# Patient Record
Sex: Female | Born: 1990
Health system: Southern US, Community
[De-identification: ages and names within clinical notes are randomized; demographics above are authoritative.]

## PROBLEM LIST (undated history)

## (undated) DIAGNOSIS — J309 Allergic rhinitis, unspecified: Secondary | ICD-10-CM

## (undated) DIAGNOSIS — U071 COVID-19: Secondary | ICD-10-CM

## (undated) DIAGNOSIS — T7840XA Allergy, unspecified, initial encounter: Secondary | ICD-10-CM

## (undated) DIAGNOSIS — F419 Anxiety disorder, unspecified: Secondary | ICD-10-CM

## (undated) HISTORY — DX: Allergic rhinitis, unspecified: J30.9

## (undated) HISTORY — DX: Allergy, unspecified, initial encounter: T78.40XA

## (undated) HISTORY — PX: WISDOM TOOTH EXTRACTION: SHX21

## (undated) HISTORY — DX: COVID-19: U07.1

## (undated) HISTORY — DX: Anxiety disorder, unspecified: F41.9

---

## 2019-07-02 DIAGNOSIS — Z01419 Encounter for gynecological examination (general) (routine) without abnormal findings: Secondary | ICD-10-CM | POA: Diagnosis not present

## 2019-11-08 DIAGNOSIS — Z20822 Contact with and (suspected) exposure to covid-19: Secondary | ICD-10-CM | POA: Diagnosis not present

## 2019-11-12 DIAGNOSIS — F411 Generalized anxiety disorder: Secondary | ICD-10-CM | POA: Diagnosis not present

## 2019-11-17 DIAGNOSIS — F411 Generalized anxiety disorder: Secondary | ICD-10-CM | POA: Diagnosis not present

## 2019-12-02 DIAGNOSIS — F411 Generalized anxiety disorder: Secondary | ICD-10-CM | POA: Diagnosis not present

## 2019-12-16 DIAGNOSIS — F411 Generalized anxiety disorder: Secondary | ICD-10-CM | POA: Diagnosis not present

## 2019-12-23 DIAGNOSIS — F411 Generalized anxiety disorder: Secondary | ICD-10-CM | POA: Diagnosis not present

## 2019-12-30 DIAGNOSIS — F411 Generalized anxiety disorder: Secondary | ICD-10-CM | POA: Diagnosis not present

## 2020-01-06 DIAGNOSIS — F411 Generalized anxiety disorder: Secondary | ICD-10-CM | POA: Diagnosis not present

## 2020-01-13 DIAGNOSIS — F411 Generalized anxiety disorder: Secondary | ICD-10-CM | POA: Diagnosis not present

## 2020-01-20 DIAGNOSIS — F411 Generalized anxiety disorder: Secondary | ICD-10-CM | POA: Diagnosis not present

## 2020-02-10 DIAGNOSIS — F411 Generalized anxiety disorder: Secondary | ICD-10-CM | POA: Diagnosis not present

## 2020-02-17 DIAGNOSIS — F411 Generalized anxiety disorder: Secondary | ICD-10-CM | POA: Diagnosis not present

## 2020-02-23 DIAGNOSIS — F411 Generalized anxiety disorder: Secondary | ICD-10-CM | POA: Diagnosis not present

## 2020-03-02 DIAGNOSIS — F411 Generalized anxiety disorder: Secondary | ICD-10-CM | POA: Diagnosis not present

## 2020-03-09 DIAGNOSIS — F411 Generalized anxiety disorder: Secondary | ICD-10-CM | POA: Diagnosis not present

## 2020-03-16 DIAGNOSIS — F411 Generalized anxiety disorder: Secondary | ICD-10-CM | POA: Diagnosis not present

## 2020-03-23 DIAGNOSIS — F411 Generalized anxiety disorder: Secondary | ICD-10-CM | POA: Diagnosis not present

## 2020-04-06 DIAGNOSIS — F411 Generalized anxiety disorder: Secondary | ICD-10-CM | POA: Diagnosis not present

## 2020-04-13 DIAGNOSIS — F411 Generalized anxiety disorder: Secondary | ICD-10-CM | POA: Diagnosis not present

## 2020-04-20 DIAGNOSIS — F411 Generalized anxiety disorder: Secondary | ICD-10-CM | POA: Diagnosis not present

## 2020-05-11 DIAGNOSIS — F411 Generalized anxiety disorder: Secondary | ICD-10-CM | POA: Diagnosis not present

## 2020-05-18 DIAGNOSIS — F411 Generalized anxiety disorder: Secondary | ICD-10-CM | POA: Diagnosis not present

## 2020-05-25 DIAGNOSIS — F411 Generalized anxiety disorder: Secondary | ICD-10-CM | POA: Diagnosis not present

## 2020-07-14 DIAGNOSIS — S39012A Strain of muscle, fascia and tendon of lower back, initial encounter: Secondary | ICD-10-CM | POA: Diagnosis not present

## 2020-08-31 DIAGNOSIS — Z01419 Encounter for gynecological examination (general) (routine) without abnormal findings: Secondary | ICD-10-CM | POA: Diagnosis not present

## 2020-08-31 DIAGNOSIS — Z1331 Encounter for screening for depression: Secondary | ICD-10-CM | POA: Diagnosis not present

## 2020-08-31 DIAGNOSIS — Z1239 Encounter for other screening for malignant neoplasm of breast: Secondary | ICD-10-CM | POA: Diagnosis not present

## 2020-08-31 DIAGNOSIS — N898 Other specified noninflammatory disorders of vagina: Secondary | ICD-10-CM | POA: Diagnosis not present

## 2020-10-17 DIAGNOSIS — Z1152 Encounter for screening for COVID-19: Secondary | ICD-10-CM | POA: Diagnosis not present

## 2020-10-17 DIAGNOSIS — Z03818 Encounter for observation for suspected exposure to other biological agents ruled out: Secondary | ICD-10-CM | POA: Diagnosis not present

## 2020-10-18 DIAGNOSIS — M791 Myalgia, unspecified site: Secondary | ICD-10-CM | POA: Diagnosis not present

## 2020-10-18 DIAGNOSIS — U071 COVID-19: Secondary | ICD-10-CM | POA: Diagnosis not present

## 2020-11-06 ENCOUNTER — Ambulatory Visit: Payer: Self-pay | Admitting: Family Medicine

## 2020-11-20 ENCOUNTER — Encounter: Payer: Self-pay | Admitting: Family Medicine

## 2020-11-20 ENCOUNTER — Other Ambulatory Visit: Payer: Self-pay

## 2020-11-20 ENCOUNTER — Ambulatory Visit: Payer: Federal, State, Local not specified - PPO | Admitting: Family Medicine

## 2020-11-20 VITALS — BP 117/78 | HR 91 | Temp 98.0°F | Ht 65.0 in | Wt 212.0 lb

## 2020-11-20 DIAGNOSIS — R200 Anesthesia of skin: Secondary | ICD-10-CM | POA: Diagnosis not present

## 2020-11-20 DIAGNOSIS — Z8616 Personal history of COVID-19: Secondary | ICD-10-CM | POA: Diagnosis not present

## 2020-11-20 DIAGNOSIS — R062 Wheezing: Secondary | ICD-10-CM | POA: Diagnosis not present

## 2020-11-20 MED ORDER — ALBUTEROL SULFATE HFA 108 (90 BASE) MCG/ACT IN AERS: 2.0000 | INHALATION_SPRAY | Freq: Four times a day (QID) | RESPIRATORY_TRACT | 0 refills | Status: DC | PRN

## 2020-11-20 NOTE — Progress Notes (Signed)
New Patient Office Visit  Subjective:  Patient ID: Brandi Ward, female    DOB: [birth year]  Age: 30 y.o. MRN: [id number]  CC:  Chief Complaint  Patient presents with  . Numbness    Bilateral hand numbness and tingling.     HPI Brandi Ward presents for bilateral hand tingling and numbness that began in Oct. 2021 without any awareness of trauma. Pt says the left hand is effected most sometimes is painful. october was a stressful time - grandmother passed away can last hours to days L hand started hurting in mid Nov none in last 2 wks, L>R ulnar side numbness   Pt would also like to discuss wheezing and shortness of breath - lingering post covid symptom that seems to come on periodically. Pt says she had covid in December 2021.  no h/o asthma, 2-3 times per week, worse at night  Past Medical History:  Diagnosis Date  . Allergic rhinitis   . Anxiety   . COVID-19     Past Surgical History:  Procedure Laterality Date  . WISDOM TOOTH EXTRACTION      Family History  Problem Relation Age of Onset  . Hyperlipidemia Mother   . Hypertension Father   . Dementia Maternal Grandmother   . Lymphoma Maternal Grandfather   . Kidney cancer Paternal Grandmother   . Heart disease Paternal Grandfather   . Suicidality Maternal Uncle   . Breast cancer Neg Hx   . Colon cancer Neg Hx     Social History   Socioeconomic History  . Marital status: Single    Spouse name: Not on file  . Number of children: 0  . Years of education: Not on file  . Highest education level: Not on file  Occupational History  . Occupation: English as a second language teacher    Comment: [employer]  Tobacco Use  . Smoking status: Never Smoker  . Smokeless tobacco: Never Used  Vaping Use  . Vaping Use: Never used  Substance and Sexual Activity  . Alcohol use: Yes    Alcohol/week: 1.0 standard drink    Types: 1 Glasses of wine per week    Comment: one glass of wine every two weeks  . Drug use: Never  . Sexual  activity: Yes    Partners: Male    Birth control/protection: Pill  Other Topics Concern  . Not on file  Social History Narrative  . Not on file   Social Determinants of Health   Financial Resource Strain: Not on file  Food Insecurity: Not on file  Transportation Needs: Not on file  Physical Activity: Not on file  Stress: Not on file  Social Connections: Not on file  Intimate Partner Violence: Not on file    ROS Review of Systems  Constitutional: Negative.   HENT: Negative.   Eyes: Negative.   Respiratory: Positive for cough, chest tightness and wheezing.   Cardiovascular: Negative.   Gastrointestinal: Negative.   Endocrine: Negative.   Genitourinary: Negative.   Musculoskeletal: Negative.   Skin: Negative.   Allergic/Immunologic: Positive for environmental allergies.  Neurological: Negative.   Hematological: Negative.   Psychiatric/Behavioral: The patient is nervous/anxious.     Objective:   Today's Vitals: BP 117/78 (BP Location: Left Arm, Patient Position: Sitting, Cuff Size: Large)   Pulse 91   Temp 98 F (36.7 C) (Oral)   Ht 5\' 5"  (1.651 m)   Wt 212 lb (96.2 kg)   SpO2 100%   BMI 35.28 kg/m   Physical Exam Vitals  reviewed.  Constitutional:      General: She is not in acute distress.    Appearance: Normal appearance. She is well-developed. She is not diaphoretic.  HENT:     Head: Normocephalic and atraumatic.     Right Ear: Tympanic membrane, ear canal and external ear normal.     Left Ear: Tympanic membrane, ear canal and external ear normal.     Nose: Nose normal.     Mouth/Throat:     Mouth: Mucous membranes are moist.     Pharynx: Oropharynx is clear. No oropharyngeal exudate.  Eyes:     General: No scleral icterus.    Conjunctiva/sclera: Conjunctivae normal.     Pupils: Pupils are equal, round, and reactive to light.  Neck:     Thyroid: No thyromegaly.  Cardiovascular:     Rate and Rhythm: Normal rate and regular rhythm.     Pulses: Normal  pulses.     Heart sounds: Normal heart sounds. No murmur heard.   Pulmonary:     Effort: Pulmonary effort is normal. No respiratory distress.     Breath sounds: Normal breath sounds. No wheezing or rales.  Abdominal:     General: There is no distension.     Palpations: Abdomen is soft.     Tenderness: There is no abdominal tenderness.  Musculoskeletal:        General: No deformity.     Cervical back: Neck supple.     Right lower leg: No edema.     Left lower leg: No edema.     Comments: No TTP on UEs. Negative Tinels and Phalens b/l No symptoms with pressure over ulnar nerve at elbow.  Lymphadenopathy:     Cervical: No cervical adenopathy.  Skin:    General: Skin is warm and dry.     Findings: No rash.  Neurological:     Mental Status: She is alert and oriented to person, place, and time. Mental status is at baseline.     Sensory: No sensory deficit.     Motor: No weakness.     Gait: Gait normal.  Psychiatric:        Mood and Affect: Mood normal.        Behavior: Behavior normal.        Thought Content: Thought content normal.     Assessment & Plan:   1. History of COVID-19 2. Wheezing - lungs clear today - no fever or symptoms concerning for pneumonia at this time - suspect residual bronchial inflammation after COVID 19 - discussed natural course, symptomatic management and return precautions - albuterol prn  3. Bilateral hand numbness - new problem x4 months - benign exam today - given bilateral nature, suspect distal issue, rather than neck radiculopathy - strength and sensation is intact on exam today - distribution is in ulnar nerve distribution - will refer to Ortho for possible NCS - discussed possibility of positional issue as she works at a desk often, could consider OT in the future also - Ambulatory referral to Orthopedic Surgery   Meds ordered this encounter  Medications  . albuterol (VENTOLIN HFA) 108 (90 Base) MCG/ACT inhaler    Sig: Inhale 2  puffs into the lungs every 6 (six) hours as needed for wheezing or shortness of breath.    Dispense:  8 g    Refill:  0    Return in about 6 months (around 05/20/2021) for CPE.  Total time spent on today's visit was greater than 45 minutes,  including both face-to-face time and nonface-to-face time personally spent on review of chart (labs and imaging), discussing labs and goals, discussing further work-up, treatment options, referrals to specialist if needed, reviewing outside records of pertinent, answering patient's questions, and coordinating care.   I, Shirlee Latch, MD, have reviewed all documentation for this visit. The documentation on 11/20/20 for the exam, diagnosis, procedures, and orders are all accurate and complete.   Bacigalupo, Marzella Schlein, MD, MPH [facility]

## 2020-11-20 NOTE — Patient Instructions (Signed)
Congratulations on your Covid-19 vaccination!  Please bring in your Covid-19 vaccination card to your next appointment or upload a picture to your MyChart for us to keep track of your vaccinations.  Thank you!     

## 2020-11-21 ENCOUNTER — Other Ambulatory Visit: Payer: Self-pay

## 2020-11-21 NOTE — Telephone Encounter (Signed)
Medication updated

## 2020-11-30 DIAGNOSIS — G5622 Lesion of ulnar nerve, left upper limb: Secondary | ICD-10-CM | POA: Diagnosis not present

## 2020-12-13 ENCOUNTER — Other Ambulatory Visit: Payer: Self-pay | Admitting: Family Medicine

## 2021-01-23 DIAGNOSIS — K080 Exfoliation of teeth due to systemic causes: Secondary | ICD-10-CM | POA: Diagnosis not present

## 2021-05-07 ENCOUNTER — Other Ambulatory Visit: Payer: Self-pay

## 2021-05-07 ENCOUNTER — Encounter (HOSPITAL_COMMUNITY): Payer: Self-pay | Admitting: Emergency Medicine

## 2021-05-07 ENCOUNTER — Emergency Department (HOSPITAL_COMMUNITY)
Admission: EM | Admit: 2021-05-07 | Discharge: 2021-05-08 | Disposition: A | Discharge status: Home / Self Care | Payer: Federal, State, Local not specified - PPO | Attending: Emergency Medicine | Admitting: Emergency Medicine

## 2021-05-07 DIAGNOSIS — K5900 Constipation, unspecified: Secondary | ICD-10-CM | POA: Diagnosis not present

## 2021-05-07 DIAGNOSIS — Z20822 Contact with and (suspected) exposure to covid-19: Secondary | ICD-10-CM | POA: Insufficient documentation

## 2021-05-07 DIAGNOSIS — Z8616 Personal history of COVID-19: Secondary | ICD-10-CM | POA: Diagnosis not present

## 2021-05-07 DIAGNOSIS — N83202 Unspecified ovarian cyst, left side: Secondary | ICD-10-CM | POA: Insufficient documentation

## 2021-05-07 DIAGNOSIS — R109 Unspecified abdominal pain: Secondary | ICD-10-CM | POA: Diagnosis not present

## 2021-05-07 DIAGNOSIS — R1084 Generalized abdominal pain: Secondary | ICD-10-CM | POA: Diagnosis not present

## 2021-05-07 LAB — URINALYSIS, ROUTINE W REFLEX MICROSCOPIC
Bilirubin Urine: NEGATIVE
Glucose, UA: NEGATIVE mg/dL
Hgb urine dipstick: NEGATIVE
Ketones, ur: NEGATIVE mg/dL
Leukocytes,Ua: NEGATIVE
Nitrite: NEGATIVE
Protein, ur: NEGATIVE mg/dL
Specific Gravity, Urine: 1.011 (ref 1.005–1.030)
pH: 5 (ref 5.0–8.0)

## 2021-05-07 LAB — COMPREHENSIVE METABOLIC PANEL
ALT: 29 U/L (ref 0–44)
AST: 29 U/L (ref 15–41)
Albumin: 3.8 g/dL (ref 3.5–5.0)
Alkaline Phosphatase: 56 U/L (ref 38–126)
Anion gap: 12 (ref 5–15)
BUN: 15 mg/dL (ref 6–20)
CO2: 21 mmol/L — ABNORMAL LOW (ref 22–32)
Calcium: 9.2 mg/dL (ref 8.9–10.3)
Chloride: 102 mmol/L (ref 98–111)
Creatinine, Ser: 0.87 mg/dL (ref 0.44–1.00)
GFR, Estimated: >60 mL/min (ref >60)
Glucose, Bld: 113 mg/dL — ABNORMAL HIGH (ref 70–99)
Potassium: 3.2 mmol/L — ABNORMAL LOW (ref 3.5–5.1)
Sodium: 135 mmol/L (ref 135–145)
Total Bilirubin: 0.7 mg/dL (ref 0.3–1.2)
Total Protein: 6.8 g/dL (ref 6.5–8.1)

## 2021-05-07 LAB — CBC WITH DIFFERENTIAL/PLATELET
Abs Immature Granulocytes: 0.1 10*3/uL — ABNORMAL HIGH (ref 0.00–0.07)
Basophils Absolute: 0 10*3/uL (ref 0.0–0.1)
Basophils Relative: 0 %
Eosinophils Absolute: 0 10*3/uL (ref 0.0–0.5)
Eosinophils Relative: 0 %
HCT: 44.1 % (ref 36.0–46.0)
Hemoglobin: 15.4 g/dL — ABNORMAL HIGH (ref 12.0–15.0)
Immature Granulocytes: 1 %
Lymphocytes Relative: 7 %
Lymphs Abs: 1.2 10*3/uL (ref 0.7–4.0)
MCH: 30.4 pg (ref 26.0–34.0)
MCHC: 34.9 g/dL (ref 30.0–36.0)
MCV: 87.2 fL (ref 80.0–100.0)
Monocytes Absolute: 0.5 10*3/uL (ref 0.1–1.0)
Monocytes Relative: 3 %
Neutro Abs: 16.4 10*3/uL — ABNORMAL HIGH (ref 1.7–7.7)
Neutrophils Relative %: 89 %
Platelets: 293 10*3/uL (ref 150–400)
RBC: 5.06 MIL/uL (ref 3.87–5.11)
RDW: 11.9 % (ref 11.5–15.5)
WBC: 18.2 10*3/uL — ABNORMAL HIGH (ref 4.0–10.5)
nRBC: 0 % (ref 0.0–0.2)

## 2021-05-07 LAB — LIPASE, BLOOD: Lipase: 30 U/L (ref 11–51)

## 2021-05-07 NOTE — ED Provider Notes (Signed)
Emergency Medicine Provider Triage Evaluation Note  Brandi Ward , a 30 y.o. female  was evaluated in triage.  Pt complains of lower abdominal pain associated with nausea, vomiting, and constipation that started earlier today. No urinary or vaginal symptoms. No fever, but endorses chills. No previous abdominal operations.   Review of Systems  Positive: Abdominal pain, nausea, vomiting, constipation Negative: fever  Physical Exam  BP 111/71 (BP Location: Right Arm)   Pulse (!) 134   Temp 98.3 F (36.8 C)   Resp 15   LMP 04/30/2021   SpO2 99%  Gen:   Awake, no distress   Resp:  Normal effort  MSK:   Moves extremities without difficulty  Other:    Medical Decision Making  Medically screening exam initiated at 9:33 PM.  Appropriate orders placed.  Brandi Ward was informed that the remainder of the evaluation will be completed by another provider, this initial triage assessment does not replace that evaluation, and the importance of remaining in the ED until their evaluation is complete.  Abdominal labs.   Brandi Ward 05/07/21 2135    Brandi Grizzle, MD 05/08/21 [phone]

## 2021-05-07 NOTE — ED Triage Notes (Signed)
Patient reports hypogastric pain with constipation this evening , denies emesis or diarrhea , no fever or chills .

## 2021-05-08 ENCOUNTER — Emergency Department (HOSPITAL_COMMUNITY): Payer: Federal, State, Local not specified - PPO

## 2021-05-08 DIAGNOSIS — R109 Unspecified abdominal pain: Secondary | ICD-10-CM | POA: Diagnosis not present

## 2021-05-08 DIAGNOSIS — K5900 Constipation, unspecified: Secondary | ICD-10-CM | POA: Diagnosis not present

## 2021-05-08 LAB — RESP PANEL BY RT-PCR (FLU A&B, COVID) ARPGX2
Influenza A by PCR: NEGATIVE
Influenza B by PCR: NEGATIVE
SARS Coronavirus 2 by RT PCR: NEGATIVE

## 2021-05-08 LAB — WET PREP, GENITAL
Clue Cells Wet Prep HPF POC: NONE SEEN
Sperm: NONE SEEN
Trich, Wet Prep: NONE SEEN
Yeast Wet Prep HPF POC: NONE SEEN

## 2021-05-08 LAB — I-STAT BETA HCG BLOOD, ED (MC, WL, AP ONLY): I-stat hCG, quantitative: <5 m[IU]/mL (ref <5)

## 2021-05-08 MED ORDER — IBUPROFEN 800 MG PO TABS: 800.0000 mg | ORAL_TABLET | Freq: Three times a day (TID) | ORAL | 0 refills | Status: AC | PRN

## 2021-05-08 MED ORDER — SODIUM CHLORIDE 0.9 % IV BOLUS
1000.0000 mL | Freq: Once | INTRAVENOUS | Status: AC
  Administered 2021-05-08: 1000 mL via INTRAVENOUS

## 2021-05-08 MED ORDER — ONDANSETRON 4 MG PO TBDP: 4.0000 mg | ORAL_TABLET | Freq: Three times a day (TID) | ORAL | 0 refills | Status: AC | PRN

## 2021-05-08 MED ORDER — IOHEXOL 300 MG/ML  SOLN
100.0000 mL | Freq: Once | INTRAMUSCULAR | Status: AC | PRN
  Administered 2021-05-08: 100 mL via INTRAVENOUS

## 2021-05-08 MED ORDER — SODIUM CHLORIDE 0.9 % IV BOLUS
500.0000 mL | Freq: Once | INTRAVENOUS | Status: AC
  Administered 2021-05-08: 500 mL via INTRAVENOUS

## 2021-05-08 MED ORDER — ACETAMINOPHEN 325 MG PO TABS
650.0000 mg | ORAL_TABLET | Freq: Once | ORAL | Status: AC | PRN
  Administered 2021-05-08: 650 mg via ORAL
  Filled 2021-05-08: qty 2

## 2021-05-08 MED ORDER — KETOROLAC TROMETHAMINE 30 MG/ML IJ SOLN
30.0000 mg | Freq: Once | INTRAMUSCULAR | Status: AC
  Administered 2021-05-08: 30 mg via INTRAVENOUS
  Filled 2021-05-08: qty 1

## 2021-05-08 MED ORDER — ONDANSETRON HCL 4 MG/2ML IJ SOLN
4.0000 mg | Freq: Once | INTRAMUSCULAR | Status: AC
  Administered 2021-05-08: 4 mg via INTRAVENOUS
  Filled 2021-05-08: qty 2

## 2021-05-08 NOTE — Discharge Instructions (Addendum)
Return if any problems.  Follow up with gynecology for recheck

## 2021-05-08 NOTE — ED Provider Notes (Signed)
Pt's  care turned over to me at 4pm.  Pelvic exam   Pt has left lower pelvic pain  scant discharge  Wet prep  shows few bacteria.  GC and Ct pending.  Pt given rx for ibuprofen and zofran.  Pt advised ovarian cyst and follow up    Osie Cheeks 05/08/21 1736    Bethann Berkshire, MD 05/10/21 1014

## 2021-05-08 NOTE — ED Provider Notes (Signed)
[facility] EMERGENCY DEPARTMENT Provider Note   CSN: [id number] Arrival date & time: 05/07/21  2048     History Chief Complaint  Patient presents with   Abdominal Pain    Brandi Ward is a 30 y.o. female.  Patient states that yesterday she started having some lower abdominal pain.  She thought she was constipated took some MiraLAX which did not help.  She also complains of feeling chills.  Patient is feeling better now  The history is provided by the patient and medical records. No language interpreter was used.  Abdominal Pain Pain location:  Generalized Pain quality: aching   Pain radiates to:  Does not radiate Pain severity:  Mild Onset quality:  Gradual Timing:  Intermittent Progression:  Waxing and waning Chronicity:  New Context: not alcohol use   Relieved by:  Nothing Worsened by:  Nothing Associated symptoms: no chest pain, no cough, no diarrhea, no fatigue and no hematuria       Past Medical History:  Diagnosis Date   Allergic rhinitis    Allergy    Anxiety    COVID-19     There are no problems to display for this patient.   Past Surgical History:  Procedure Laterality Date   WISDOM TOOTH EXTRACTION       OB History     Gravida  0   Para  0   Term  0   Preterm  0   AB  0   Living  0      SAB  0   IAB  0   Ectopic  0   Multiple  0   Live Births  0           Family History  Problem Relation Age of Onset   Hyperlipidemia Mother    Hypertension Father    Dementia Maternal Grandmother    Lymphoma Maternal Grandfather    Kidney cancer Paternal Grandmother    Heart disease Paternal Grandfather    Suicidality Maternal Uncle    Breast cancer Neg Hx    Colon cancer Neg Hx     Social History   Tobacco Use   Smoking status: Never   Smokeless tobacco: Never  Vaping Use   Vaping Use: Never used  Substance Use Topics   Alcohol use: Yes    Alcohol/week: 1.0 standard drink    Types: 1 Glasses of wine  per week    Comment: one glass of wine every two weeks   Drug use: Never    Home Medications Prior to Admission medications   Medication Sig Start Date End Date Taking? Authorizing Provider  albuterol (VENTOLIN HFA) 108 (90 Base) MCG/ACT inhaler INHALE 2 PUFFS INTO THE LUNGS EVERY 6 HOURS AS NEEDED FOR WHEEZING OR SHORTNESS OF BREATH Patient taking differently: Inhale 2 puffs into the lungs every 6 (six) hours as needed for wheezing or shortness of breath. 12/13/20  Yes Bacigalupo, Marzella Schlein, MD  norethindrone-ethinyl estradiol (LOESTRIN) 1-20 MG-MCG tablet Take 1 tablet by mouth daily. 08/31/20  Yes [provider]    Allergies    Patient has no known allergies.  Review of Systems   Review of Systems  Constitutional:  Negative for appetite change and fatigue.  HENT:  Negative for congestion, ear discharge and sinus pressure.   Eyes:  Negative for discharge.  Respiratory:  Negative for cough.   Cardiovascular:  Negative for chest pain.  Gastrointestinal:  Positive for abdominal pain. Negative for diarrhea.  Genitourinary:  Negative for frequency and hematuria.  Musculoskeletal:  Negative for back pain.  Skin:  Negative for rash.  Neurological:  Negative for seizures and headaches.  Psychiatric/Behavioral:  Negative for hallucinations.    Physical Exam Updated Vital Signs BP 104/69   Pulse 77   Temp 99.1 F (37.3 C)   Resp 16   LMP 04/30/2021   SpO2 98%   Physical Exam Vitals and nursing note reviewed.  Constitutional:      Appearance: She is well-developed.  HENT:     Head: Normocephalic.     Nose: Nose normal.  Eyes:     General: No scleral icterus.    Conjunctiva/sclera: Conjunctivae normal.  Neck:     Thyroid: No thyromegaly.  Cardiovascular:     Rate and Rhythm: Normal rate and regular rhythm.     Heart sounds: No murmur heard.   No friction rub. No gallop.  Pulmonary:     Breath sounds: No stridor. No wheezing or rales.  Chest:     Chest wall: No  tenderness.  Abdominal:     General: There is no distension.     Tenderness: There is abdominal tenderness. There is no rebound.  Musculoskeletal:        General: Normal range of motion.     Cervical back: Neck supple.  Lymphadenopathy:     Cervical: No cervical adenopathy.  Skin:    Findings: No erythema or rash.  Neurological:     Mental Status: She is alert and oriented to person, place, and time.     Motor: No abnormal muscle tone.     Coordination: Coordination normal.  Psychiatric:        Behavior: Behavior normal.    ED Results / Procedures / Treatments   Labs (all labs ordered are listed, but only abnormal results are displayed) Labs Reviewed  CBC WITH DIFFERENTIAL/PLATELET - Abnormal; Notable for the following components:      Result Value   WBC 18.2 (*)    Hemoglobin 15.4 (*)    Neutro Abs 16.4 (*)    Abs Immature Granulocytes 0.10 (*)    All other components within normal limits  COMPREHENSIVE METABOLIC PANEL - Abnormal; Notable for the following components:   Potassium 3.2 (*)    CO2 21 (*)    Glucose, Bld 113 (*)    All other components within normal limits  RESP PANEL BY RT-PCR (FLU A&B, COVID) ARPGX2  LIPASE, BLOOD  URINALYSIS, ROUTINE W REFLEX MICROSCOPIC  PREGNANCY, URINE  HCG, QUANTITATIVE, PREGNANCY  I-STAT BETA HCG BLOOD, ED ([facility], [facility], [facility] ONLY)    EKG None  Radiology CT ABDOMEN PELVIS W CONTRAST  Result Date: 05/08/2021 CLINICAL DATA:  30 year old female with history of abdominal pain and constipation. EXAM: CT ABDOMEN AND PELVIS WITH CONTRAST TECHNIQUE: Multidetector CT imaging of the abdomen and pelvis was performed using the standard protocol following bolus administration of intravenous contrast. CONTRAST:  OMNIPAQUE IOHEXOL 300 MG/ML  SOLN COMPARISON:  No priors. FINDINGS: Lower chest: Unremarkable. Hepatobiliary: No suspicious cystic or solid hepatic lesions. No intra or extrahepatic biliary ductal dilatation. Gallbladder is normal in  appearance. Pancreas: No pancreatic mass. No pancreatic ductal dilatation. No pancreatic or peripancreatic fluid collections or inflammatory changes. Spleen: Unremarkable. Adrenals/Urinary Tract: Bilateral kidneys and bilateral adrenal glands are normal in appearance. No hydroureteronephrosis. Urinary bladder is normal in appearance. Stomach/Bowel: The appearance of the stomach is normal. No pathologic dilatation of small bowel or colon. Normal appendix. Vascular/Lymphatic: No significant atherosclerotic disease, aneurysm  or dissection noted in the abdominal or pelvic vasculature. No lymphadenopathy noted in the abdomen or pelvis. Reproductive: Uterus and right ovary are unremarkable in appearance. 2.7 cm low-intermediate attenuation (21 HU) lesion in the left ovary, incompletely characterize, but likely a dominant follicle. Other: No significant volume of ascites.  No pneumoperitoneum. Musculoskeletal: Sclerotic lesion with narrow zone of transition in the right femoral head measuring 1.3 cm in diameter (axial image 81 of series 3), likely to represent a bone island. There are no other aggressive appearing lytic or blastic lesions noted in the visualized portions of the skeleton. IMPRESSION: 1. No acute findings are noted in the abdomen or pelvis to account for the patient's symptoms. 2. 2.7 cm low-intermediate attenuation lesion in the left ovary, likely a dominant follicle. Electronically Signed   By: Trudie Reed M.D.   On: 05/08/2021 16:06    Procedures Procedures   Medications Ordered in ED Medications  sodium chloride 0.9 % bolus 500 mL (has no administration in time range)  acetaminophen (TYLENOL) tablet 650 mg (650 mg Oral Given 05/08/21 0914)  sodium chloride 0.9 % bolus 1,000 mL (0 mLs Intravenous Stopped 05/08/21 1606)  ketorolac (TORADOL) 30 MG/ML injection 30 mg (30 mg Intravenous Given 05/08/21 1301)  ondansetron (ZOFRAN) injection 4 mg (4 mg Intravenous Given 05/08/21 1300)  iohexol  (OMNIPAQUE) 300 MG/ML solution 100 mL (100 mLs Intravenous Contrast Given 05/08/21 1539)    ED Course  I have reviewed the triage vital signs and the nursing notes.  Pertinent labs & imaging results that were available during my care of the patient were reviewed by me and considered in my medical decision making (see chart for details).    MDM Rules/Calculators/A&P                           Patient with lower abdominal pain.  CT scan negative.  Pelvic exam done by Langston Masker pa.  Diagnosis ovarian cyst. Final Clinical Impression(s) / ED Diagnoses Final diagnoses:  None    Rx / DC Orders ED Discharge Orders     None        Bethann Berkshire, MD 05/10/21 1042

## 2021-05-09 LAB — GC/CHLAMYDIA PROBE AMP (~~LOC~~) NOT AT ARMC
Chlamydia: NEGATIVE
Comment: NEGATIVE
Comment: NORMAL
Neisseria Gonorrhea: NEGATIVE

## 2021-05-17 ENCOUNTER — Ambulatory Visit (INDEPENDENT_AMBULATORY_CARE_PROVIDER_SITE_OTHER): Payer: Federal, State, Local not specified - PPO | Admitting: Family Medicine

## 2021-05-17 ENCOUNTER — Other Ambulatory Visit: Payer: Self-pay

## 2021-05-17 ENCOUNTER — Encounter: Payer: Self-pay | Admitting: Family Medicine

## 2021-05-17 VITALS — BP 121/85 | HR 88 | Temp 98.0°F | Wt 196.0 lb

## 2021-05-17 DIAGNOSIS — Z114 Encounter for screening for human immunodeficiency virus [HIV]: Secondary | ICD-10-CM

## 2021-05-17 DIAGNOSIS — E66811 Obesity, class 1: Secondary | ICD-10-CM | POA: Insufficient documentation

## 2021-05-17 DIAGNOSIS — Z1159 Encounter for screening for other viral diseases: Secondary | ICD-10-CM | POA: Diagnosis not present

## 2021-05-17 DIAGNOSIS — Z0000 Encounter for general adult medical examination without abnormal findings: Secondary | ICD-10-CM

## 2021-05-17 DIAGNOSIS — Z23 Encounter for immunization: Secondary | ICD-10-CM | POA: Diagnosis not present

## 2021-05-17 DIAGNOSIS — Z6832 Body mass index (BMI) 32.0-32.9, adult: Secondary | ICD-10-CM

## 2021-05-17 DIAGNOSIS — D72829 Elevated white blood cell count, unspecified: Secondary | ICD-10-CM

## 2021-05-17 DIAGNOSIS — E876 Hypokalemia: Secondary | ICD-10-CM | POA: Diagnosis not present

## 2021-05-17 DIAGNOSIS — R739 Hyperglycemia, unspecified: Secondary | ICD-10-CM

## 2021-05-17 DIAGNOSIS — E669 Obesity, unspecified: Secondary | ICD-10-CM | POA: Insufficient documentation

## 2021-05-17 MED ORDER — DICYCLOMINE HCL 10 MG PO CAPS: 10.0000 mg | ORAL_CAPSULE | Freq: Three times a day (TID) | ORAL | 2 refills | Status: AC | PRN

## 2021-05-17 NOTE — Assessment & Plan Note (Signed)
Discussed importance of healthy weight management Discussed diet and exercise  

## 2021-05-17 NOTE — Progress Notes (Signed)
Complete physical exam   Patient: Brandi Ward   DOB: [birth year]   30 y.o. Female  MRN: [id number] Visit Date: 05/17/2021  Today's healthcare provider: Shirlee Latch, MD   Chief Complaint  Patient presents with   Annual Exam   Subjective    Brandi Ward is a 30 y.o. female who presents today for a complete physical exam.  She reports consuming a general diet. The patient does not participate in regular exercise at present. She generally feels fairly well. She reports sleeping fairly well. She does have additional problems to discuss today.  HPI   Follow-up ER visit for Ovarian Cyst Patient was seen in the ER on 05/09/21 for left ovarian cyst. Due to her ovarian cyst she had had difficult eating and vaginal pain for the lat week. She is in transition of moving and needs to schedule with a new Gyn. As a child she was taking bentyl because she had GI issues. She denies being diagnosed with IBS. She reports that regardless of what she eats abdominal pain begins. She is concerned whether this could be associated with the cysts.   - she will be following up with GYN  Screening  She is due for a paps and is amenable to scheduling an appointment.   Vaccines  She is due for a tetanus and she has received 3 COVID vaccines.   Past Medical History:  Diagnosis Date   Allergic rhinitis    Allergy    Anxiety    COVID-19    Past Surgical History:  Procedure Laterality Date   WISDOM TOOTH EXTRACTION     Social History   Socioeconomic History   Marital status: Single    Spouse name: Not on file   Number of children: 0   Years of education: Not on file   Highest education level: Not on file  Occupational History   Occupation: post-doc fellow    Comment: [employer]  Tobacco Use   Smoking status: Never   Smokeless tobacco: Never  Vaping Use   Vaping Use: Never used  Substance and Sexual Activity   Alcohol use: Yes    Alcohol/week: 1.0 standard drink    Types: 1 Glasses  of wine per week    Comment: one glass of wine every two weeks   Drug use: Never   Sexual activity: Yes    Partners: Male    Birth control/protection: Pill  Other Topics Concern   Not on file  Social History Narrative   Not on file   Social Determinants of Health   Financial Resource Strain: Not on file  Food Insecurity: Not on file  Transportation Needs: Not on file  Physical Activity: Not on file  Stress: Not on file  Social Connections: Not on file  Intimate Partner Violence: Not on file   Family Status  Relation Name Status   Mother  Alive   Father  Alive   MGM  (Not Specified)   MGF  (Not Specified)   PGM  (Not Specified)   PGF  (Not Specified)   Mat Uncle  Other   Neg Hx  (Not Specified)   Family History  Problem Relation Age of Onset   Hyperlipidemia Mother    Hypertension Father    Dementia Maternal Grandmother    Lymphoma Maternal Grandfather    Kidney cancer Paternal Grandmother    Heart disease Paternal Grandfather    Suicidality Maternal Uncle    Breast cancer Neg Hx  Colon cancer Neg Hx    No Known Allergies  Patient Care Team: Erasmo Downer, MD as PCP - General (Family Medicine)   Medications: Outpatient Medications Prior to Visit  Medication Sig   albuterol (VENTOLIN HFA) 108 (90 Base) MCG/ACT inhaler INHALE 2 PUFFS INTO THE LUNGS EVERY 6 HOURS AS NEEDED FOR WHEEZING OR SHORTNESS OF BREATH (Patient taking differently: Inhale 2 puffs into the lungs every 6 (six) hours as needed for wheezing or shortness of breath.)   ibuprofen (ADVIL) 800 MG tablet Take 1 tablet (800 mg total) by mouth every 8 (eight) hours as needed.   norethindrone-ethinyl estradiol (LOESTRIN) 1-20 MG-MCG tablet Take 1 tablet by mouth daily.   ondansetron (ZOFRAN ODT) 4 MG disintegrating tablet Take 1 tablet (4 mg total) by mouth every 8 (eight) hours as needed for nausea or vomiting.   No facility-administered medications prior to visit.   Review of Systems   Constitutional: Negative.  Negative for chills, fatigue and fever.  HENT: Negative.  Negative for ear pain, sinus pressure, sinus pain and sore throat.   Eyes: Negative.  Negative for pain and visual disturbance.  Respiratory: Negative.  Negative for cough, chest tightness, shortness of breath and wheezing.   Cardiovascular: Negative.  Negative for chest pain, palpitations and leg swelling.  Gastrointestinal:  Positive for abdominal pain. Negative for blood in stool, constipation, diarrhea, nausea and vomiting.  Endocrine: Negative.   Genitourinary:  Positive for vaginal discharge. Negative for dysuria, flank pain, frequency, pelvic pain and urgency.  Musculoskeletal: Negative.  Negative for back pain, myalgias and neck pain.  Skin: Negative.   Allergic/Immunologic: Negative.   Neurological: Negative.  Negative for dizziness, seizures, syncope, weakness, light-headedness, numbness and headaches.  Hematological: Negative.   Psychiatric/Behavioral: Negative.       Objective    BP 121/85 (BP Location: Left Arm, Patient Position: Sitting, Cuff Size: Normal)   Pulse 88   Temp 98 F (36.7 C) (Oral)   Wt 196 lb (88.9 kg)   LMP 04/30/2021   SpO2 98%   BMI 32.62 kg/m    Physical Exam Vitals reviewed.  Constitutional:      General: She is not in acute distress.    Appearance: Normal appearance. She is well-developed and normal weight. She is not diaphoretic.  HENT:     Head: Normocephalic and atraumatic.     Right Ear: Hearing, tympanic membrane, ear canal and external ear normal.     Left Ear: Hearing, tympanic membrane, ear canal and external ear normal.     Nose: Nose normal.     Mouth/Throat:     Mouth: Mucous membranes are moist.     Pharynx: Oropharynx is clear. No oropharyngeal exudate.  Eyes:     General: No scleral icterus.    Extraocular Movements: Extraocular movements intact.     Conjunctiva/sclera: Conjunctivae normal.     Pupils: Pupils are equal, round, and  reactive to light.  Neck:     Thyroid: No thyromegaly.  Cardiovascular:     Rate and Rhythm: Normal rate and regular rhythm.     Pulses: Normal pulses.     Heart sounds: Normal heart sounds. No murmur heard. Pulmonary:     Effort: Pulmonary effort is normal. No respiratory distress.     Breath sounds: Normal breath sounds. No wheezing or rales.  Abdominal:     General: Abdomen is flat. Bowel sounds are normal. There is no distension.     Palpations: Abdomen is soft.  Tenderness: There is no abdominal tenderness.  Musculoskeletal:        General: No deformity. Normal range of motion.     Cervical back: Normal range of motion and neck supple.     Right lower leg: No edema.     Left lower leg: No edema.  Lymphadenopathy:     Cervical: No cervical adenopathy.  Skin:    General: Skin is warm and dry.     Findings: No rash.  Neurological:     General: No focal deficit present.     Mental Status: She is alert and oriented to person, place, and time. Mental status is at baseline.     Sensory: No sensory deficit.     Motor: No weakness.     Gait: Gait normal.  Psychiatric:        Mood and Affect: Mood normal.        Behavior: Behavior normal.        Thought Content: Thought content normal.        Judgment: Judgment normal.    Last depression screening scores PHQ 2/9 Scores 05/17/2021 11/20/2020  PHQ - 2 Score 0 1  PHQ- 9 Score 1 2   Last fall risk screening Fall Risk  11/20/2020  Falls in the past year? 0  Number falls in past yr: 0  Injury with Fall? 0   Last Audit-C alcohol use screening Alcohol Use Disorder Test (AUDIT) 05/17/2021  1. How often do you have a drink containing alcohol? 1  2. How many drinks containing alcohol do you have on a typical day when you are drinking? 0  3. How often do you have six or more drinks on one occasion? 0  AUDIT-C Score 1  Alcohol Brief Interventions/Follow-up -   A score of 3 or more in women, and 4 or more in men indicates increased  risk for alcohol abuse, EXCEPT if all of the points are from question 1   No results found for any visits on 05/17/21.  Assessment & Plan     Problem List Items Addressed This Visit       Other   Class 1 obesity without serious comorbidity with body mass index (BMI) of 32.0 to 32.9 in adult    Discussed importance of healthy weight management Discussed diet and exercise        Other Visit Diagnoses     Annual physical exam    -  Primary   Relevant Orders   HIV antibody (with reflex)   Hepatitis C Antibody   Tdap vaccine greater than or equal to 7yo IM   Basic Metabolic Panel (BMET)   CBC w/Diff/Platelet   Hemoglobin A1c   Need for tetanus booster       Relevant Orders   Tdap vaccine greater than or equal to 7yo IM   Encounter for hepatitis C screening test for low risk patient       Relevant Orders   Hepatitis C Antibody   Encounter for screening for HIV       Relevant Orders   HIV antibody (with reflex)   Leukocytosis, unspecified type       Relevant Orders   CBC w/Diff/Platelet   Hypokalemia       Relevant Orders   Basic Metabolic Panel (BMET)   Hyperglycemia       Relevant Orders   Hemoglobin A1c       Routine Health Maintenance and Physical Exam  Exercise Activities and Dietary recommendations  Goals   None     Immunization History  Administered Date(s) Administered   Influenza-Unspecified 08/30/2020   Moderna Sars-Covid-2 Vaccination 10/29/2019, 11/26/2019    Health Maintenance  Topic Date Due   Hepatitis C Screening  Never done   PAP-Cervical Cytology Screening  Never done   PAP SMEAR-Modifier  Never done   TETANUS/TDAP  06/27/2015   COVID-19 Vaccine (3 - Booster for Moderna series) 04/24/2020   INFLUENZA VACCINE  05/21/2021   HIV Screening  Completed   Pneumococcal Vaccine 5-73 Years old  Aged Out   HPV VACCINES  Aged Out    Discussed health benefits of physical activity, and encouraged her to engage in regular exercise appropriate  for her age and condition.   Return in about 1 year (around 05/17/2022) for CPE.     I,Essence Turner,acting as a Neurosurgeon for Shirlee Latch, MD.,have documented all relevant documentation on the behalf of Shirlee Latch, MD,as directed by  Shirlee Latch, MD while in the presence of Shirlee Latch, MD.  I, Shirlee Latch, MD, have reviewed all documentation for this visit. The documentation on 05/17/21 for the exam, diagnosis, procedures, and orders are all accurate and complete.   Merlyn Bollen, Marzella Schlein, MD, MPH [facility]

## 2021-05-23 LAB — CBC WITH DIFFERENTIAL/PLATELET
Basophils Absolute: 0 10*3/uL (ref 0.0–0.2)
Basos: 0 %
EOS (ABSOLUTE): 0.1 10*3/uL (ref 0.0–0.4)
Eos: 2 %
Hematocrit: 44 % (ref 34.0–46.6)
Hemoglobin: 14.4 g/dL (ref 11.1–15.9)
Immature Grans (Abs): 0 10*3/uL (ref 0.0–0.1)
Immature Granulocytes: 0 %
Lymphocytes Absolute: 1.9 10*3/uL (ref 0.7–3.1)
Lymphs: 38 %
MCH: 29.8 pg (ref 26.6–33.0)
MCHC: 32.7 g/dL (ref 31.5–35.7)
MCV: 91 fL (ref 79–97)
Monocytes Absolute: 0.4 10*3/uL (ref 0.1–0.9)
Monocytes: 8 %
Neutrophils Absolute: 2.6 10*3/uL (ref 1.4–7.0)
Neutrophils: 52 %
Platelets: 249 10*3/uL (ref 150–450)
RBC: 4.83 x10E6/uL (ref 3.77–5.28)
RDW: 12.1 % (ref 11.7–15.4)
WBC: 4.9 10*3/uL (ref 3.4–10.8)

## 2021-05-23 LAB — BASIC METABOLIC PANEL
BUN/Creatinine Ratio: 14 (ref 9–23)
BUN: 9 mg/dL (ref 6–20)
CO2: 19 mmol/L — ABNORMAL LOW (ref 20–29)
Calcium: 8.9 mg/dL (ref 8.7–10.2)
Chloride: 106 mmol/L (ref 96–106)
Creatinine, Ser: 0.64 mg/dL (ref 0.57–1.00)
Glucose: 85 mg/dL (ref 65–99)
Potassium: 4.2 mmol/L (ref 3.5–5.2)
Sodium: 138 mmol/L (ref 134–144)
eGFR: 123 mL/min/{1.73_m2} (ref >59)

## 2021-05-23 LAB — HEMOGLOBIN A1C
Est. average glucose Bld gHb Est-mCnc: 97 mg/dL
Hgb A1c MFr Bld: 5 % (ref 4.8–5.6)

## 2021-05-23 LAB — HEPATITIS C ANTIBODY: Hep C Virus Ab: <0.1 s/co ratio (ref 0.0–0.9)

## 2021-05-23 LAB — HIV ANTIBODY (ROUTINE TESTING W REFLEX): HIV Screen 4th Generation wRfx: NONREACTIVE

## 2021-05-28 ENCOUNTER — Encounter: Payer: Self-pay | Admitting: Family Medicine

## 2021-06-15 ENCOUNTER — Encounter: Payer: Self-pay | Admitting: Family Medicine

## 2022-07-13 IMAGING — CT CT ABD-PELV W/ CM
2 of 4 series · 16 of 46 positions shown, 18 images · IV contrast (omnipaque)
Comparison: No priors.

CLINICAL DATA: 30-year-old female with history of abdominal pain
and constipation.

EXAM:
CT ABDOMEN AND PELVIS WITH CONTRAST
TECHNIQUE: Multidetector CT imaging of the abdomen and pelvis was performed
using the standard protocol following bolus administration of
intravenous contrast.
CONTRAST:  100mL OMNIPAQUE IOHEXOL 300 MG/ML  SOLN

[Series 3: a/p w/ 5mm · axial · 0.97mm/px · z∈[+930,+1370]mm · 13 of 96 slices shown, 15 images]
[im 4/96  soft-tissue]
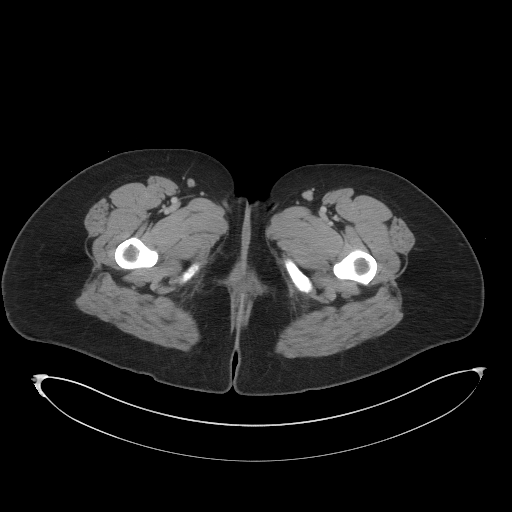
[im 4/96  bone]
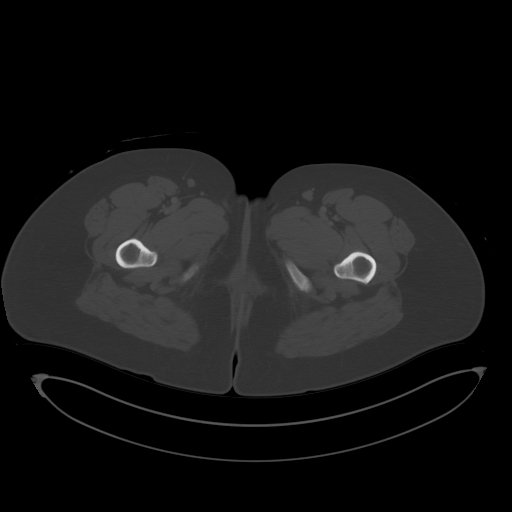
[im 12/96  soft-tissue]
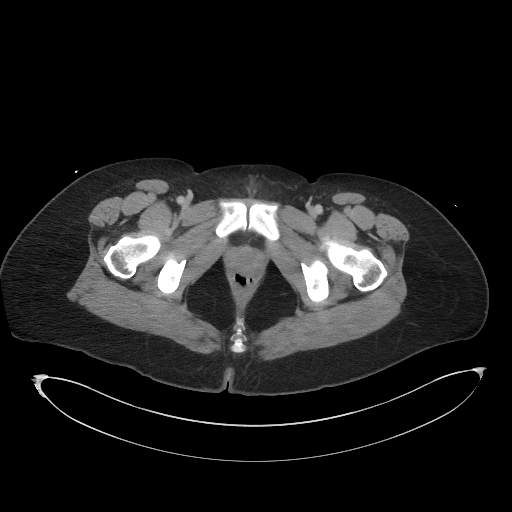
[im 20/96  soft-tissue]
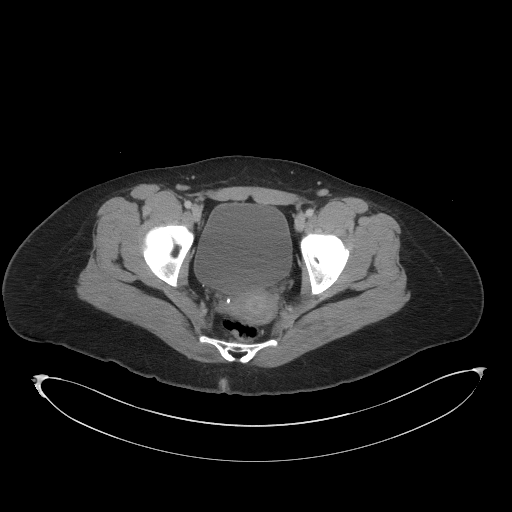
[im 28/96  soft-tissue]
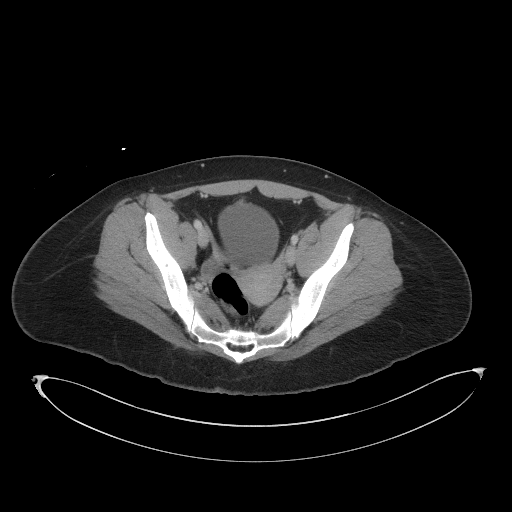
[im 32/96  soft-tissue]
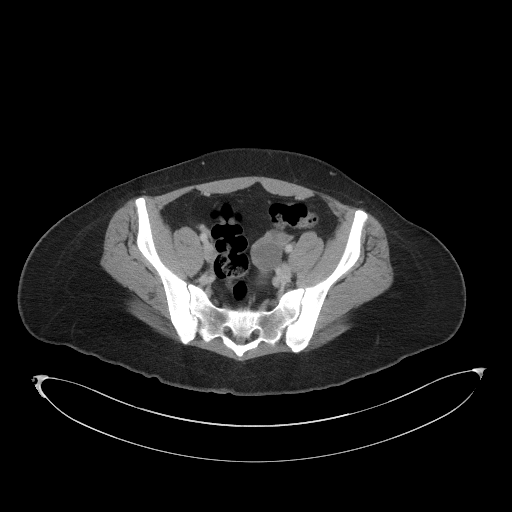
[im 40/96  soft-tissue]
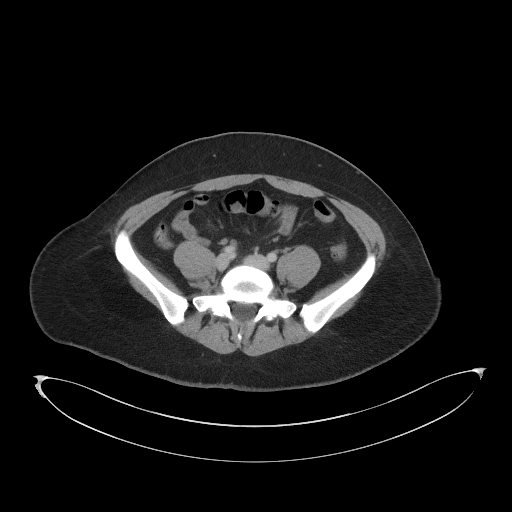
[im 48/96  soft-tissue]
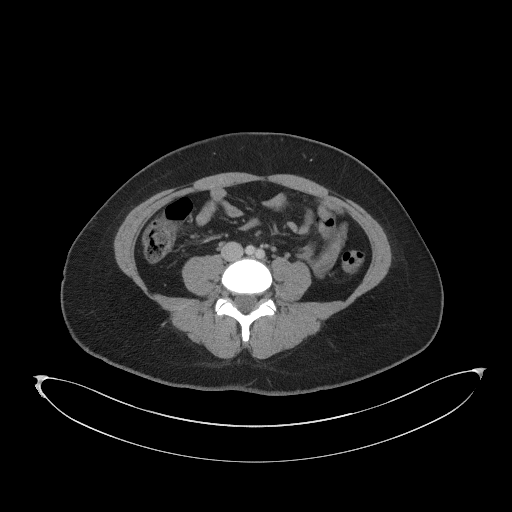
[im 56/96  soft-tissue]
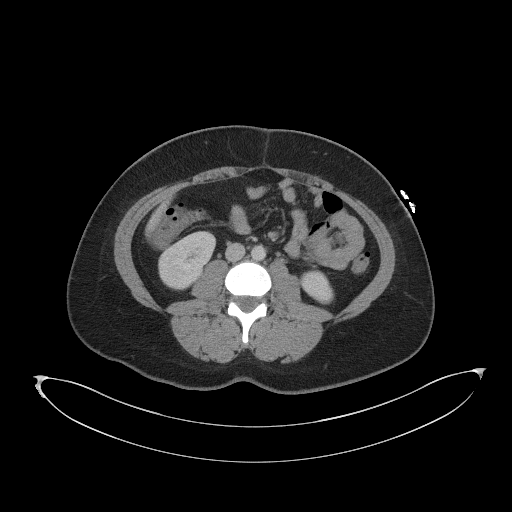
[im 64/96  soft-tissue]
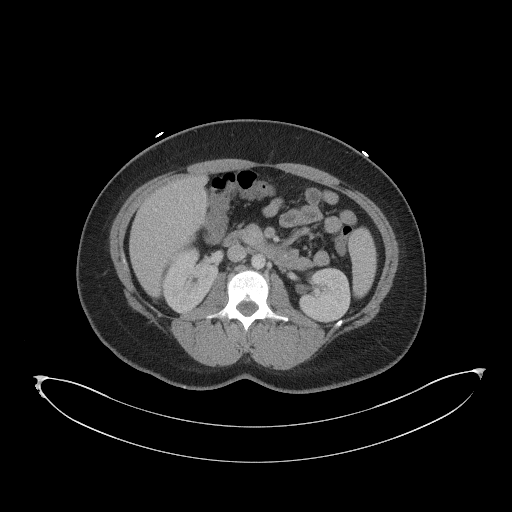
[im 64/96  bone]
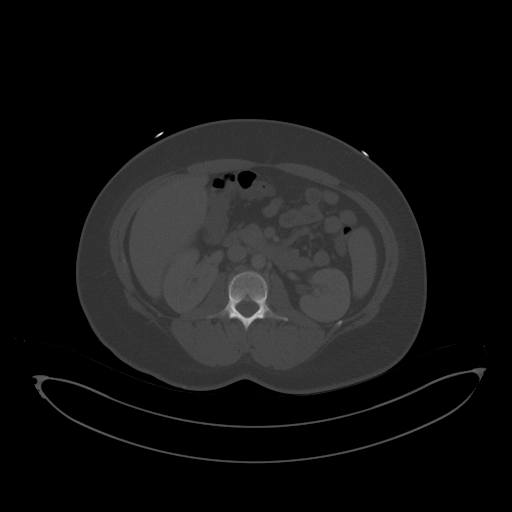
[im 68/96  soft-tissue]
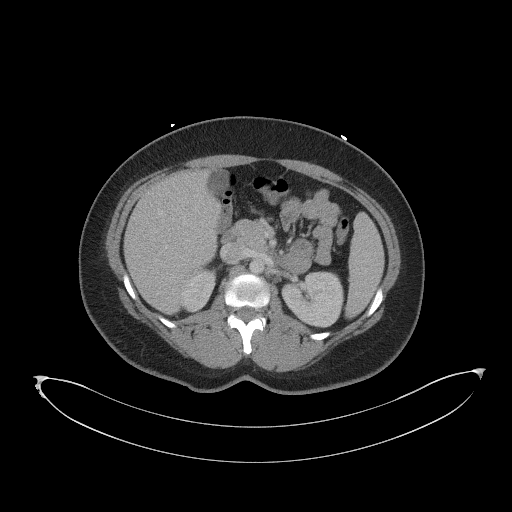
[im 76/96  soft-tissue]
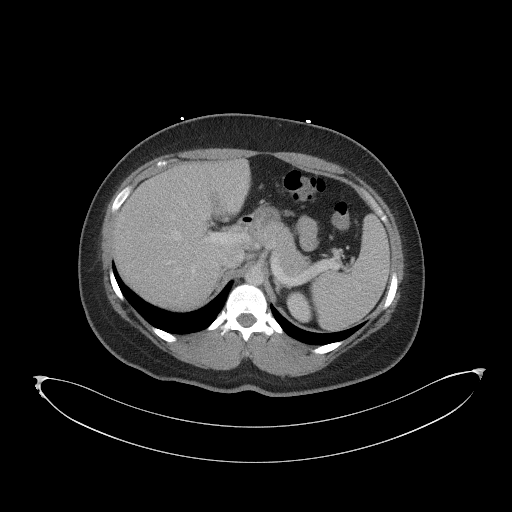
[im 84/96  soft-tissue]
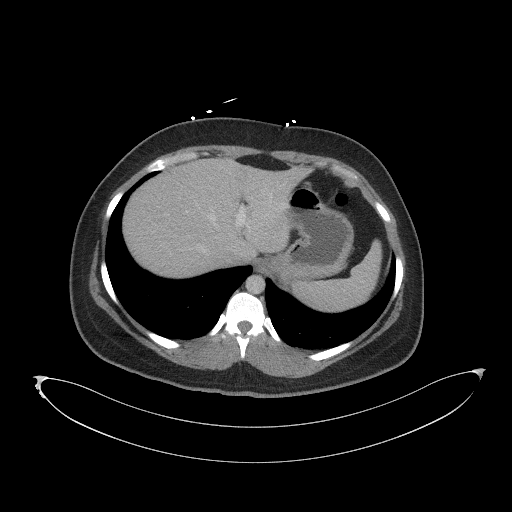
[im 92/96  soft-tissue]
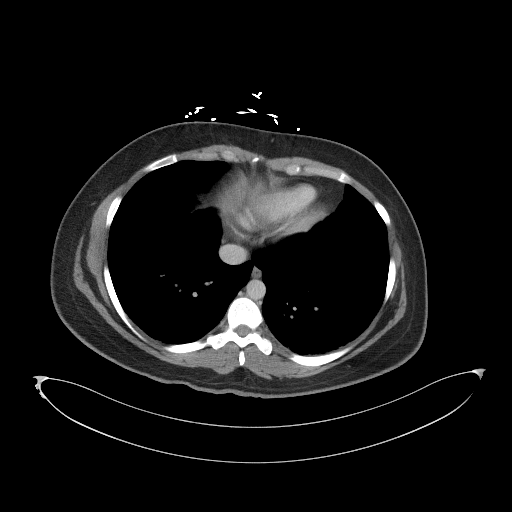

[Series 6: a/p w/ cor · coronal · 0.93mm/px · 3 of 135 slices shown]
[im 45/135  soft-tissue]
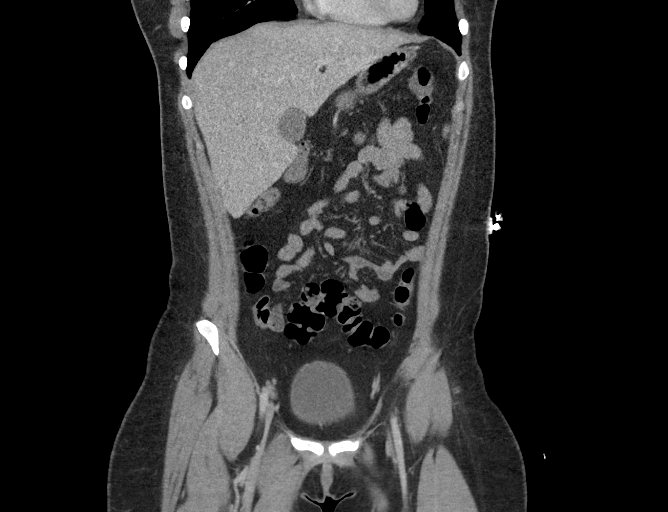
[im 60/135  soft-tissue]
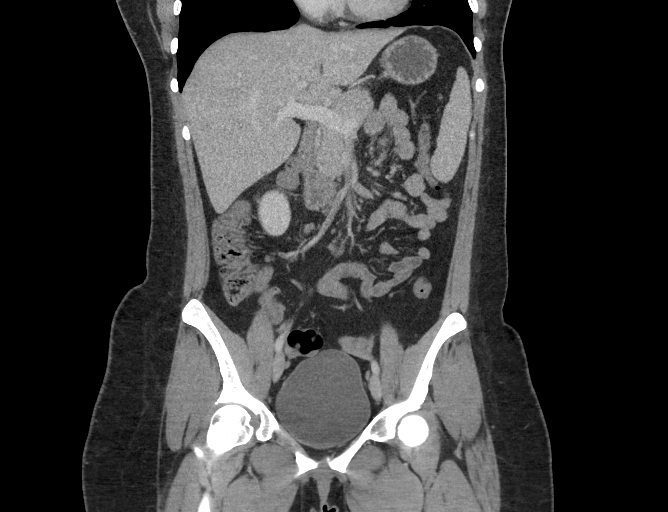
[im 75/135  soft-tissue]
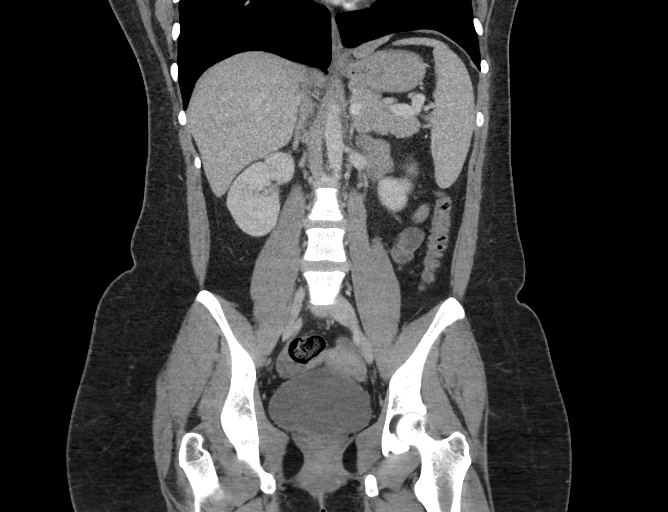

[16 of 46 positions shown; findings below may reference images not displayed]

FINDINGS: Lower chest: Unremarkable.

Hepatobiliary: No suspicious cystic or solid hepatic lesions. No
intra or extrahepatic biliary ductal dilatation. Gallbladder is
normal in appearance.

Pancreas: No pancreatic mass. No pancreatic ductal dilatation. No
pancreatic or peripancreatic fluid collections or inflammatory
changes.

Spleen: Unremarkable.

Adrenals/Urinary Tract: Bilateral kidneys and bilateral adrenal
glands are normal in appearance. No hydroureteronephrosis. Urinary
bladder is normal in appearance.

Stomach/Bowel: The appearance of the stomach is normal. No
pathologic dilatation of small bowel or colon. Normal appendix.

Vascular/Lymphatic: No significant atherosclerotic disease, aneurysm
or dissection noted in the abdominal or pelvic vasculature. No
lymphadenopathy noted in the abdomen or pelvis.

Reproductive: Uterus and right ovary are unremarkable in appearance.
2.7 cm low-intermediate attenuation (21 HU) lesion in the left
ovary, incompletely characterize, but likely a dominant follicle.

Other: No significant volume of ascites.  No pneumoperitoneum.

Musculoskeletal: Sclerotic lesion with narrow zone of transition in
the right femoral head measuring 1.3 cm in diameter (axial image 81
of series 3), likely to represent a bone island. There are no other
aggressive appearing lytic or blastic lesions noted in the
visualized portions of the skeleton.
IMPRESSION: 1. No acute findings are noted in the abdomen or pelvis to account
for the patient's symptoms.
2. 2.7 cm low-intermediate attenuation lesion in the left ovary,
likely a dominant follicle.
